# Patient Record
Sex: Female | Born: 1985 | Race: Asian | Hispanic: No | Marital: Married
Health system: Southern US, Community
[De-identification: ages and names within clinical notes are randomized; demographics above are authoritative.]

---
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
## 2006-07-26 ENCOUNTER — Emergency Department (HOSPITAL_COMMUNITY): Admission: EM | Admit: 2006-07-26 | Discharge: 2006-07-26 | Payer: Self-pay | Admitting: Emergency Medicine

## 2008-01-18 IMAGING — CR DG HAND COMPLETE 3+V*L*
3 series · 3 of 3 positions shown · non-contrast
Comparison: none

HISTORY: Left hand swelling, shut hand in door

LEFT HAND 3 VIEWS:
Joint spaces preserved.
Mineralization normal.
No fracture, dislocation, or bone destruction.
Very small benign appearing cyst in first metacarpal.
Fingers superimposed on lateral view.

[x hand pa left *]
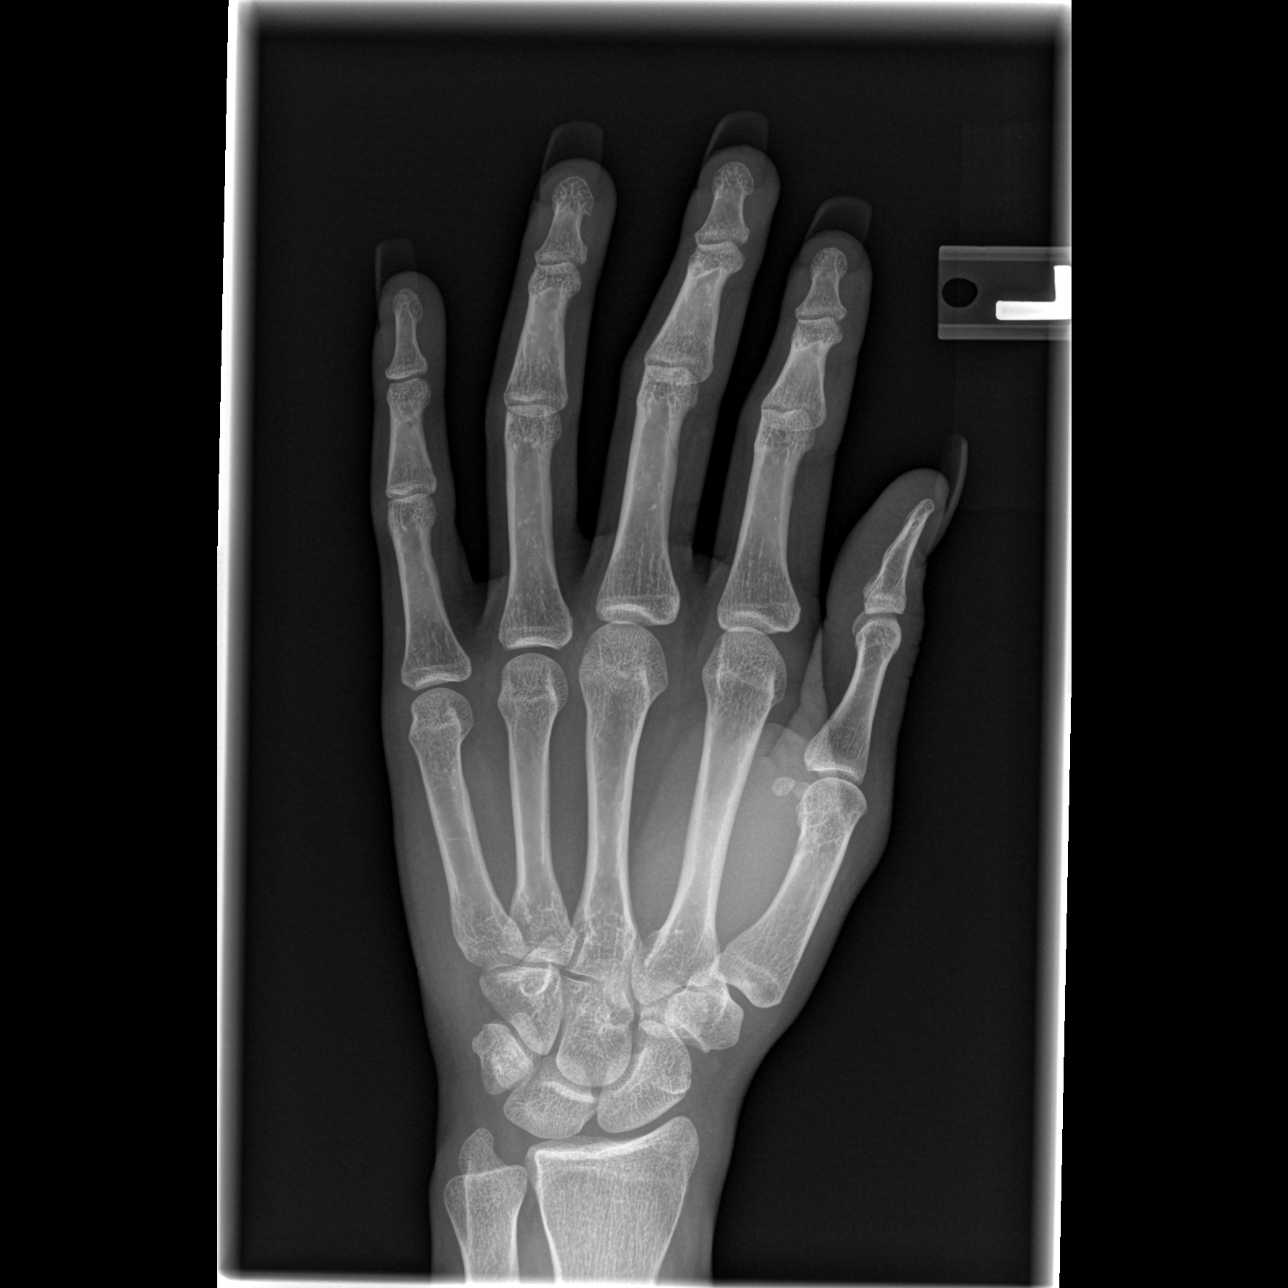

[x hand oblique left *]
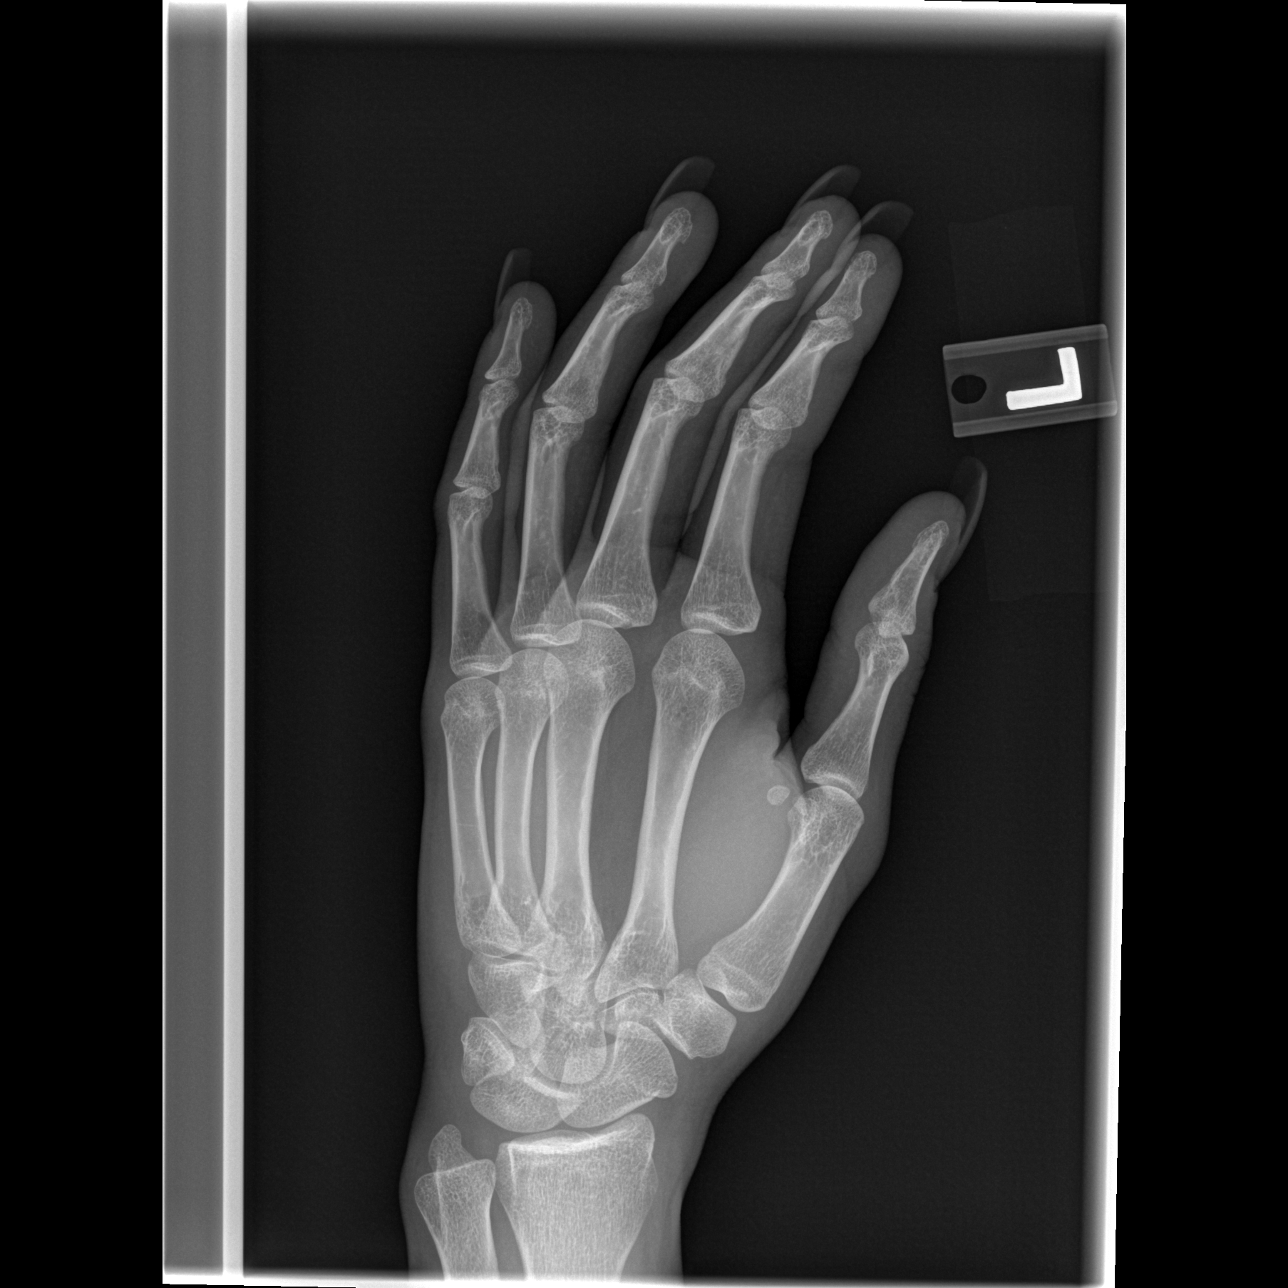

[x hand lat left *]
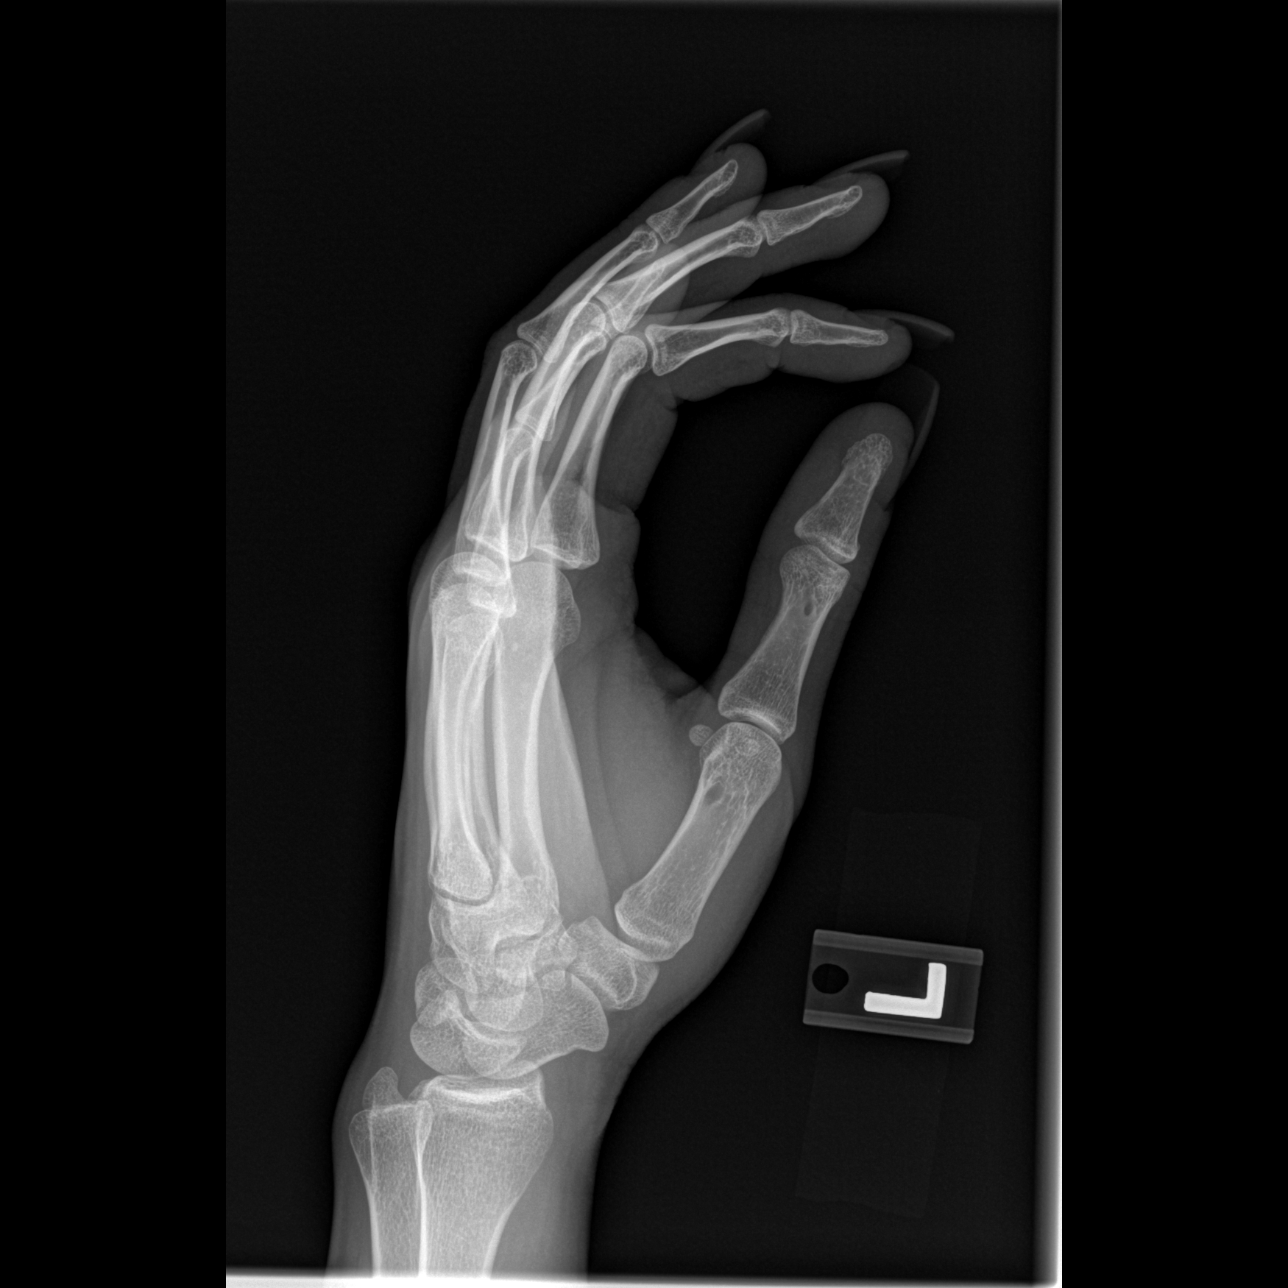

[3 of 3 positions shown; findings below may reference images not displayed]

IMPRESSION: No acute bony abnormalities.

## 2013-05-17 ENCOUNTER — Ambulatory Visit: Payer: Self-pay | Admitting: Family Medicine

## 2013-05-28 LAB — WOUND CULTURE

## 2013-12-19 ENCOUNTER — Emergency Department: Payer: Self-pay | Admitting: Emergency Medicine

## 2013-12-19 LAB — DRUG SCREEN, URINE

## 2013-12-19 LAB — COMPREHENSIVE METABOLIC PANEL
ALBUMIN: 3.8 g/dL (ref 3.4–5.0)
AST: 18 U/L (ref 15–37)
Alkaline Phosphatase: 50 U/L
Anion Gap: 10 (ref 7–16)
BUN: 9 mg/dL (ref 7–18)
Bilirubin,Total: 0.2 mg/dL (ref 0.2–1.0)
CO2: 21 mmol/L (ref 21–32)
CREATININE: 0.82 mg/dL (ref 0.60–1.30)
Calcium, Total: 8.7 mg/dL (ref 8.5–10.1)
Chloride: 108 mmol/L — ABNORMAL HIGH (ref 98–107)
Glucose: 85 mg/dL (ref 65–99)
OSMOLALITY: 275 (ref 275–301)
POTASSIUM: 3.8 mmol/L (ref 3.5–5.1)
SGPT (ALT): 25 U/L
Sodium: 139 mmol/L (ref 136–145)
TOTAL PROTEIN: 7.5 g/dL (ref 6.4–8.2)

## 2013-12-19 LAB — SALICYLATE LEVEL: SALICYLATES, SERUM: 4.8 mg/dL — AB

## 2013-12-19 LAB — LITHIUM LEVEL: Lithium: <0.2 mmol/L — ABNORMAL LOW

## 2013-12-19 LAB — LIPASE, BLOOD: Lipase: 156 U/L (ref 73–393)

## 2013-12-19 LAB — URINALYSIS, COMPLETE
BILIRUBIN, UR: NEGATIVE
BLOOD: NEGATIVE
GLUCOSE, UR: NEGATIVE mg/dL (ref 0–75)
Ketone: NEGATIVE
LEUKOCYTE ESTERASE: NEGATIVE
Nitrite: NEGATIVE
PH: 6 (ref 4.5–8.0)
Protein: 100
Specific Gravity: 1.017 (ref 1.003–1.030)
WBC UR: 3 /HPF (ref 0–5)

## 2013-12-19 LAB — CBC
HCT: 40.9 % (ref 35.0–47.0)
HGB: 13.3 g/dL (ref 12.0–16.0)
MCH: 28.2 pg (ref 26.0–34.0)
MCHC: 32.6 g/dL (ref 32.0–36.0)
MCV: 87 fL (ref 80–100)
Platelet: 228 10*3/uL (ref 150–440)
RBC: 4.73 10*6/uL (ref 3.80–5.20)
RDW: 14.9 % — ABNORMAL HIGH (ref 11.5–14.5)
WBC: 8.9 10*3/uL (ref 3.6–11.0)

## 2013-12-19 LAB — T4, FREE: Free Thyroxine: 1.07 ng/dL (ref 0.76–1.46)

## 2013-12-19 LAB — TSH: Thyroid Stimulating Horm: 0.858 u[IU]/mL

## 2013-12-19 LAB — PREGNANCY, URINE: PREGNANCY TEST, URINE: NEGATIVE m[IU]/mL

## 2013-12-19 LAB — ETHANOL: Ethanol: 50 mg/dL (ref 0–80)

## 2013-12-19 LAB — ACETAMINOPHEN LEVEL: Acetaminophen: <2 ug/mL

## 2014-06-27 NOTE — Consult Note (Signed)
PATIENT NAME:  Carrie Aguilar, Carrie Aguilar MR#:  [id number] DATE OF BIRTH:  29-Apr-1985  DATE OF CONSULTATION:  12/19/2013  REFERRING PHYSICIAN:   CONSULTING PHYSICIAN:  Lyllie Cobbins K. Torey Reinard, MD  SUBJECTIVE: The patient was [facility] in [facility] at [facility]. The patient is a [age] year old African American female who is full-time Human resources officeremployee and works for [employer] since September 2014. In addition, she is a Physicist, medicalfull-time student at [employer]. Married for 1 year and having marital conflicts with her husband to the extent that she wanted to cut herself and it was superficial. The patient and her husband both called the police officers and husband hung up on them and they came here and brought her here on IVC.   HISTORY OF PRESENT ILLNESS: The patient reports that she has been feeling increasingly depressed recently and she and her husband are having marital conflicts and she found his ex-girlfriend on the cellphone and she has been cheating herself. In addition, she had been drinking at the rate of 1 or 2 beers, which she has increased to 4 or 5 beers a day for the past month or so and after she has had arguments with her spouse.   PAST PSYCHIATRIC HISTORY: No history of inpatient hospitalization to psychiatry. Was being followed by Dr. Horton Finerichard Wessley in 2010 for depression and when she slit her wrist. He started her on lithium which made her head hurt and so she quit taking the same. Not being followed by any psychiatrist and not on any medications at this time.   ALCOHOL AND DRUGS: Admits that she has been drinking alcohol at 1 or 2 per day for several months and currently she has increased it to 4 or 5 per day as soon as she started having marital conflicts with her husband. Does admit to smoking nicotine cigarettes at 1/2 pack per day for several years.   MENTAL STATUS: The patient is alert and oriented to place, person and time, fully aware of the situation that  [facility]. Affect is appropriate with her mood which is upset and irritable about her husband and her having marital conflicts and both of them cheating on each other. No psychosis. Does not appear to be responding to any stimuli. Denies any suicidal or homicidal ideation or plans. Contracts for safety and realizes that she has to work through her marriage. Cognition intact. General knowledge and information fair. Insight and judgment fair and adequate.  IMPRESSION:  1.  History of bipolar disorder, depressed. 2.  Marital conflict leading to depression with suicidal ideas, resolved.  RECOMMENDATIONS: Discontinue involuntary commitment. Discharge the patient home and she will get followup appointment on outpatient basis where she will learn coping skills and dealing with her marriage and she and her husband will have marital counseling if they decide to live together.   ____________________________ Jannet MantisSurya K. Guss Bundehalla, MD skc:sb D: 12/19/2013 14:49:37 ET T: 12/19/2013 15:11:50 ET JOB#: [id number]  cc: Monika SalkSurya K. Guss Bundehalla, MD, <Dictator> Beau FannySURYA K Aleiyah Halpin MD ELECTRONICALLY SIGNED 12/20/2013 12:02
